# Patient Record
Sex: Female | Born: 1960 | Race: Black or African American | Hispanic: No | Marital: Married
Health system: Southern US, Community
[De-identification: ages and names within clinical notes are randomized; demographics above are authoritative.]

---

## 1998-05-02 ENCOUNTER — Inpatient Hospital Stay (HOSPITAL_COMMUNITY): Admission: AD | Admit: 1998-05-02 | Discharge: 1998-05-07 | Payer: Self-pay | Admitting: Family Medicine

## 1999-03-20 ENCOUNTER — Inpatient Hospital Stay (HOSPITAL_COMMUNITY): Admission: AD | Admit: 1999-03-20 | Discharge: 1999-04-07 | Payer: Self-pay | Admitting: Family Medicine

## 1999-03-21 ENCOUNTER — Encounter: Payer: Self-pay | Admitting: Family Medicine

## 1999-03-26 ENCOUNTER — Encounter: Payer: Self-pay | Admitting: Family Medicine

## 1999-03-27 ENCOUNTER — Encounter: Payer: Self-pay | Admitting: Gastroenterology

## 2001-09-18 ENCOUNTER — Encounter (HOSPITAL_COMMUNITY): Admission: RE | Admit: 2001-09-18 | Discharge: 2001-10-18 | Payer: Self-pay | Admitting: Rheumatology

## 2002-03-05 ENCOUNTER — Encounter (HOSPITAL_COMMUNITY): Admission: RE | Admit: 2002-03-05 | Discharge: 2002-04-04 | Payer: Self-pay | Admitting: Rheumatology

## 2002-08-20 ENCOUNTER — Encounter (HOSPITAL_COMMUNITY): Admission: RE | Admit: 2002-08-20 | Discharge: 2002-09-19 | Payer: Self-pay | Admitting: Rheumatology

## 2003-02-18 ENCOUNTER — Encounter (HOSPITAL_COMMUNITY): Admission: RE | Admit: 2003-02-18 | Discharge: 2003-03-20 | Payer: Self-pay | Admitting: Rheumatology

## 2003-09-02 ENCOUNTER — Encounter (HOSPITAL_COMMUNITY): Admission: RE | Admit: 2003-09-02 | Discharge: 2003-09-30 | Payer: Self-pay | Admitting: Rheumatology

## 2003-09-03 ENCOUNTER — Encounter: Payer: Self-pay | Admitting: Family Medicine

## 2003-09-03 ENCOUNTER — Ambulatory Visit (HOSPITAL_COMMUNITY): Admission: RE | Admit: 2003-09-03 | Discharge: 2003-09-03 | Payer: Self-pay | Admitting: Family Medicine

## 2004-08-10 ENCOUNTER — Emergency Department (HOSPITAL_COMMUNITY): Admission: EM | Admit: 2004-08-10 | Discharge: 2004-08-10 | Payer: Self-pay | Admitting: Emergency Medicine

## 2005-06-20 ENCOUNTER — Ambulatory Visit (HOSPITAL_COMMUNITY): Admission: RE | Admit: 2005-06-20 | Discharge: 2005-06-20 | Payer: Self-pay | Admitting: Family Medicine

## 2006-05-03 ENCOUNTER — Inpatient Hospital Stay (HOSPITAL_COMMUNITY): Admission: EM | Admit: 2006-05-03 | Discharge: 2006-05-07 | Payer: Self-pay | Admitting: Emergency Medicine

## 2006-05-06 ENCOUNTER — Ambulatory Visit: Payer: Self-pay | Admitting: Internal Medicine

## 2006-05-10 ENCOUNTER — Inpatient Hospital Stay (HOSPITAL_COMMUNITY): Admission: EM | Admit: 2006-05-10 | Discharge: 2006-05-12 | Payer: Self-pay | Admitting: Emergency Medicine

## 2006-05-31 ENCOUNTER — Emergency Department (HOSPITAL_COMMUNITY): Admission: EM | Admit: 2006-05-31 | Discharge: 2006-05-31 | Payer: Self-pay | Admitting: Emergency Medicine

## 2006-06-12 ENCOUNTER — Inpatient Hospital Stay (HOSPITAL_COMMUNITY): Admission: EM | Admit: 2006-06-12 | Discharge: 2006-06-26 | Payer: Self-pay | Admitting: Emergency Medicine

## 2007-07-05 IMAGING — CT CT HEAD W/O CM
1 series · 16 of 26 positions shown, 20 images · IV contrast (agent unspecified)
Comparison: None

CLINICAL DATA: Altered level of consciousness

HEAD CT WITHOUT CONTRAST:
TECHNIQUE: 5mm collimated images were obtained from the base of the skull
through the vertex according to standard protocol without contrast.

[Series 918: — · axial · 0.45mm/px · z∈[-642,-527]mm · 16 of 26 slices shown, 20 images]
[im 2/26  brain]
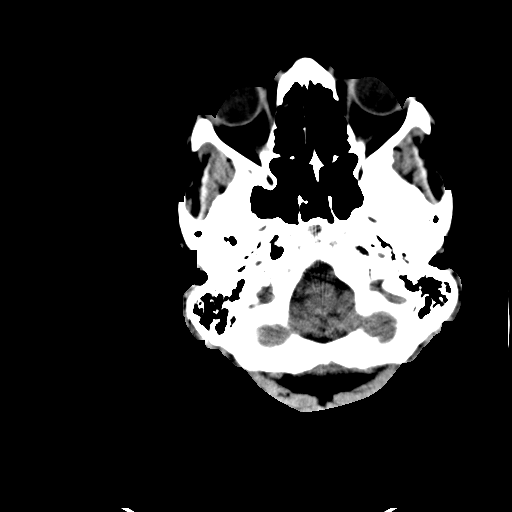
[im 2/26  bone]
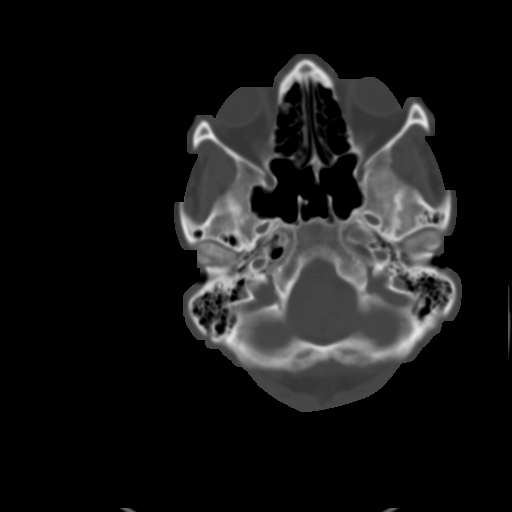
[im 4/26  brain]
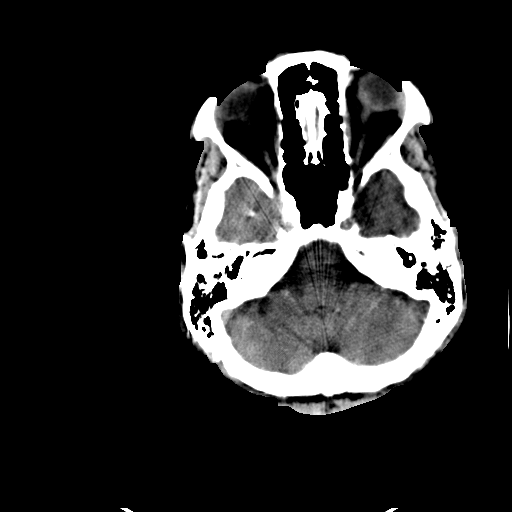
[im 5/26  brain]
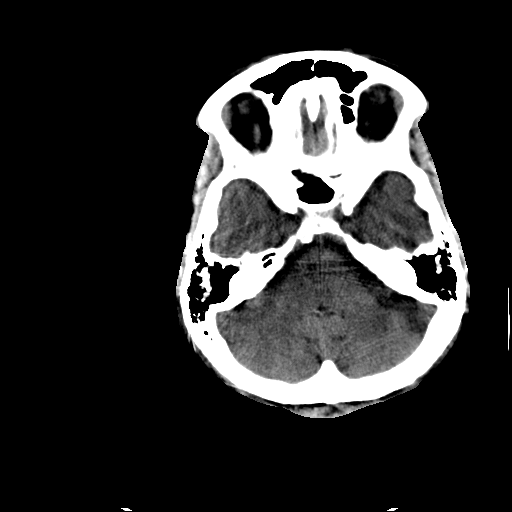
[im 7/26  brain]
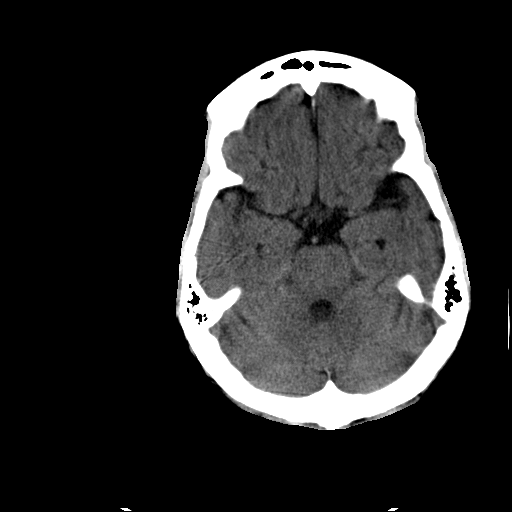
[im 8/26  brain]
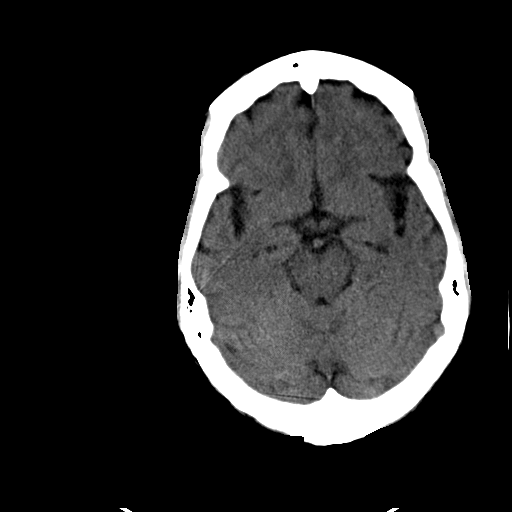
[im 8/26  bone]
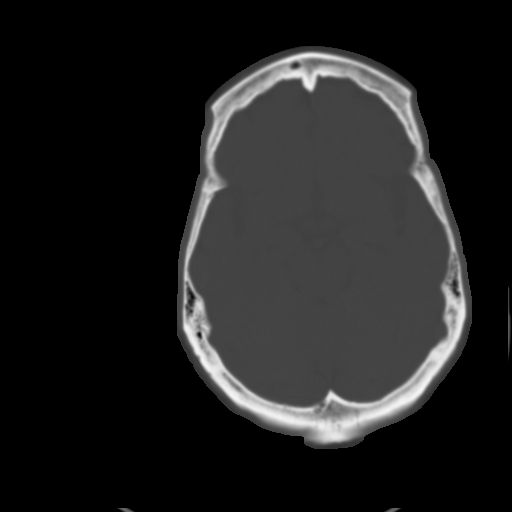
[im 10/26  brain]
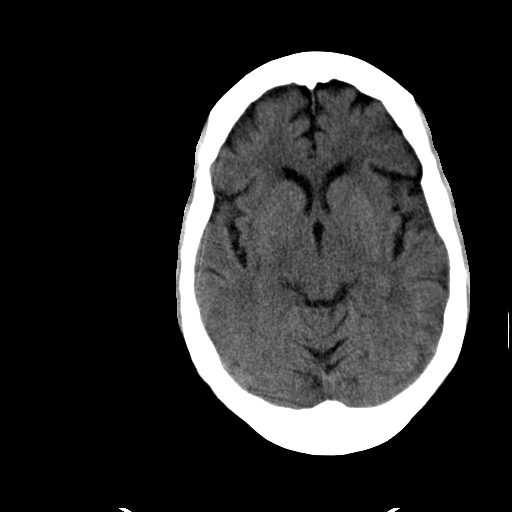
[im 11/26  brain]
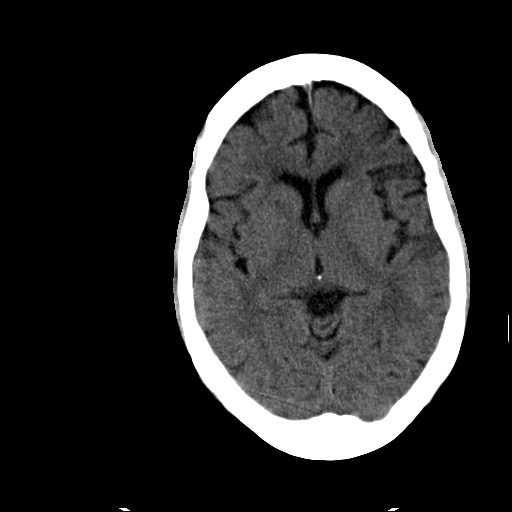
[im 13/26  brain]
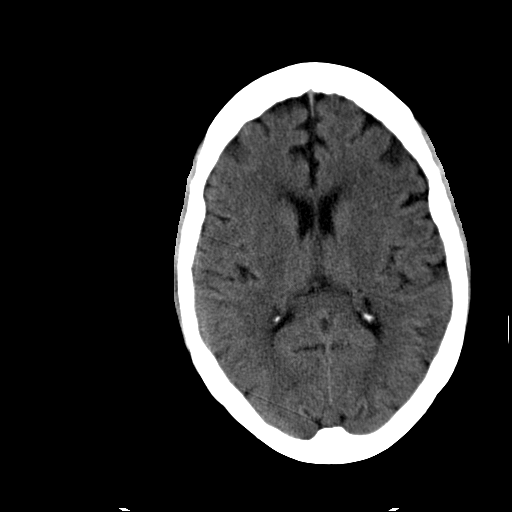
[im 14/26  brain]
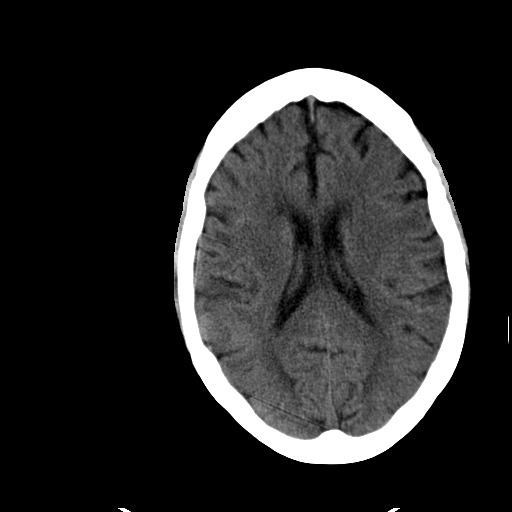
[im 14/26  bone]
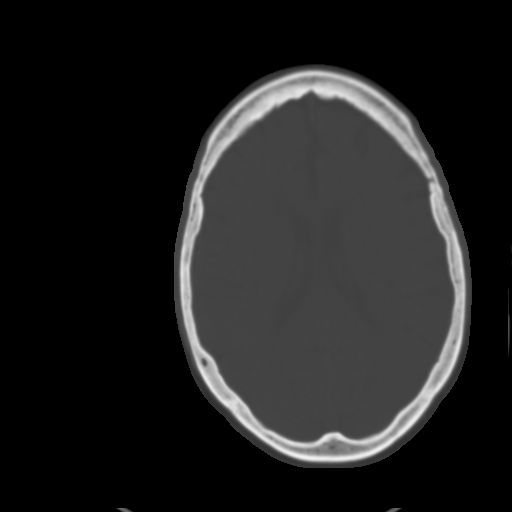
[im 16/26  brain]
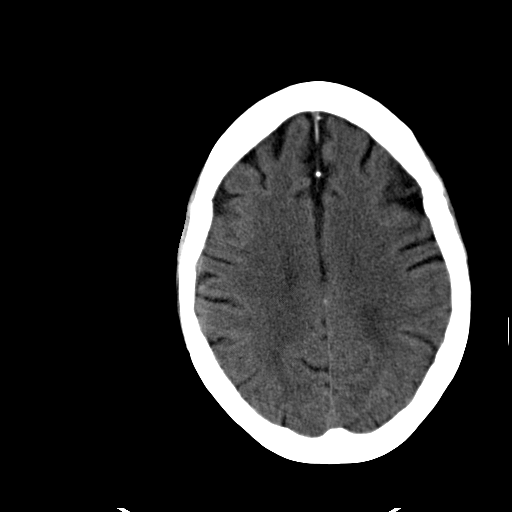
[im 17/26  brain]
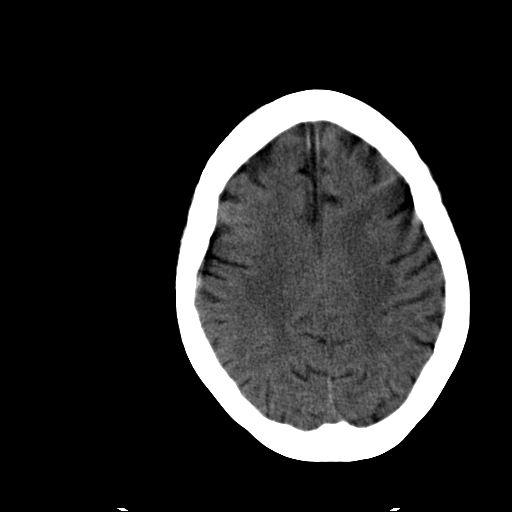
[im 19/26  brain]
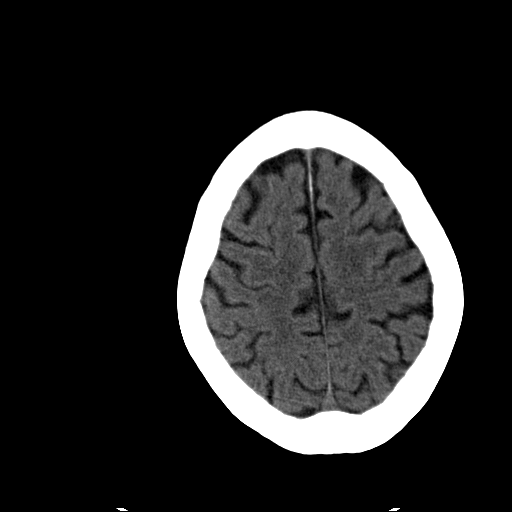
[im 20/26  brain]
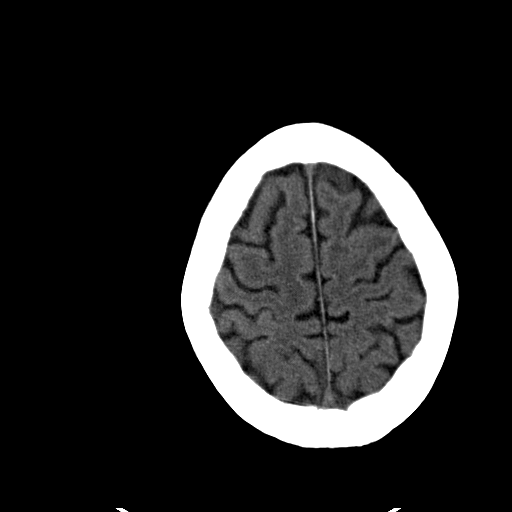
[im 20/26  bone]
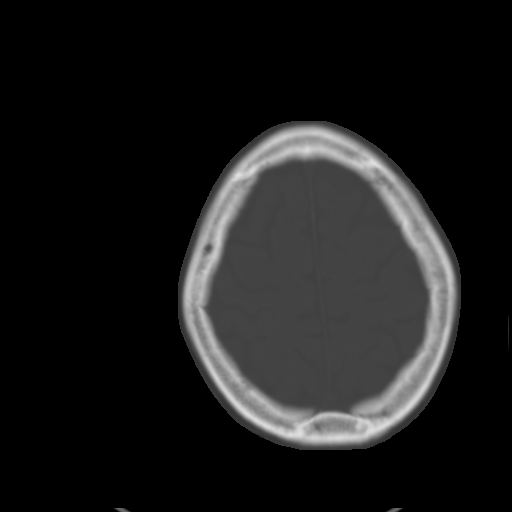
[im 22/26  brain]
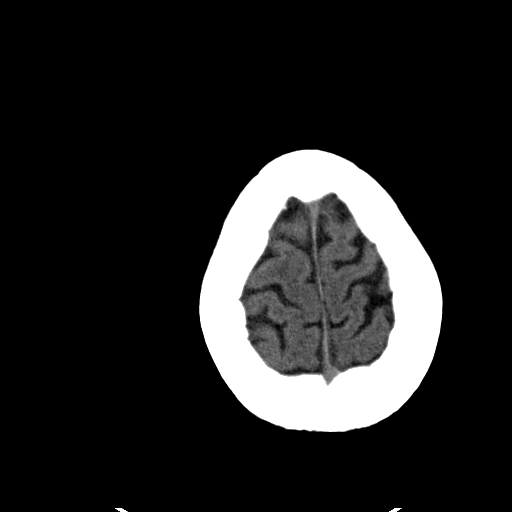
[im 23/26  brain]
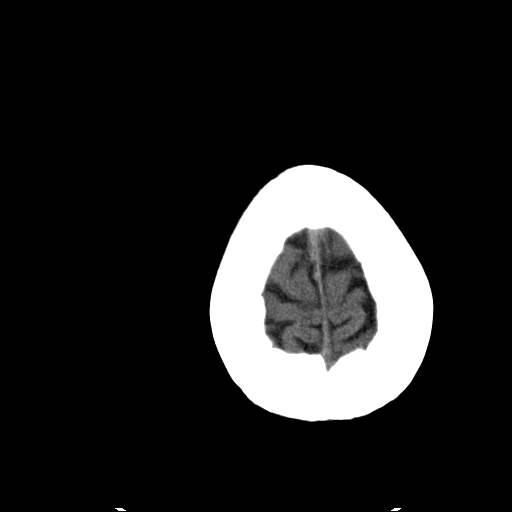
[im 25/26  brain]
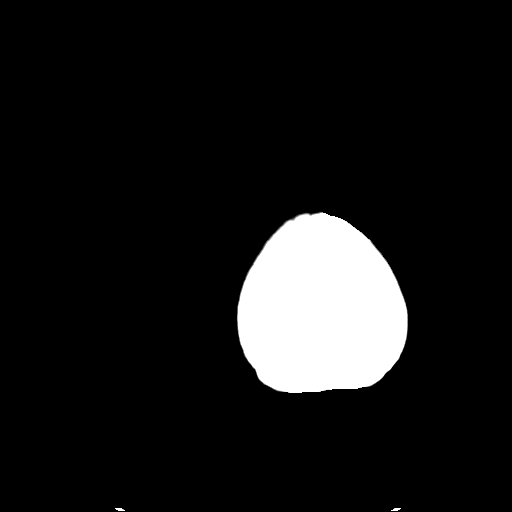

[16 of 26 positions shown; findings below may reference images not displayed]

FINDINGS: There is no evidence of intracranial hemorrhage, brain edema, or mass
effect.  No other intra-axial abnormalities are seen, and the ventricles are
within normal limits.  No abnormal extra-axial fluid collections or masses are
identified.  No skull abnormalities are noted.
IMPRESSION: No acute intracranial abnormality.

## 2007-08-14 IMAGING — CT CT HEAD W/O CM
1 series · 16 of 30 positions shown, 20 images · IV contrast (agent unspecified)
Comparison: 05/11/2006.

CLINICAL DATA: Altered mental status, altered level of consciousness.
 HEAD CT WITHOUT CONTRAST:
TECHNIQUE: Contiguous axial images were obtained from the base of the skull through the vertex according to standard protocol without contrast.

[Series 5015: — · axial · 0.45mm/px · z∈[-650,-515]mm · 16 of 30 slices shown, 20 images]
[im 2/30  brain]
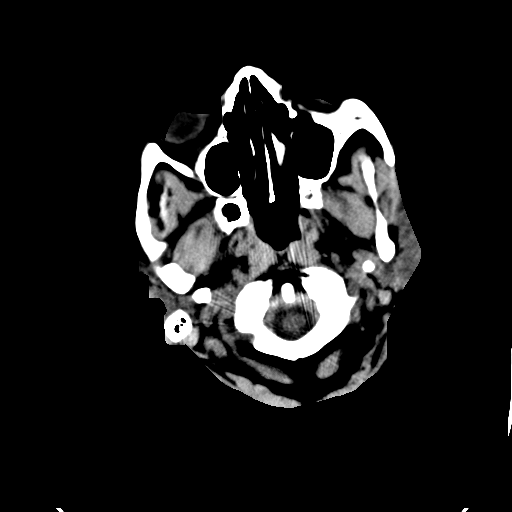
[im 2/30  bone]
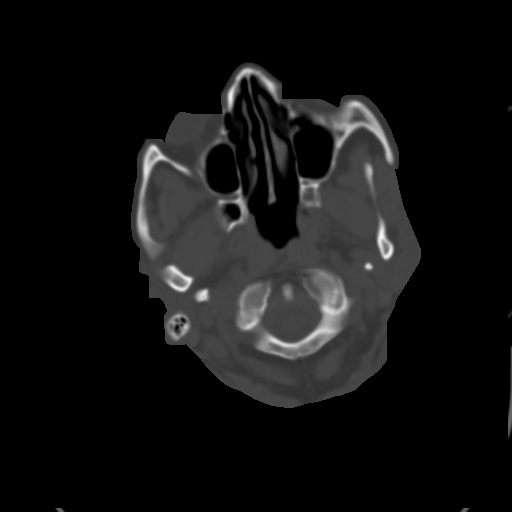
[im 4/30  brain]
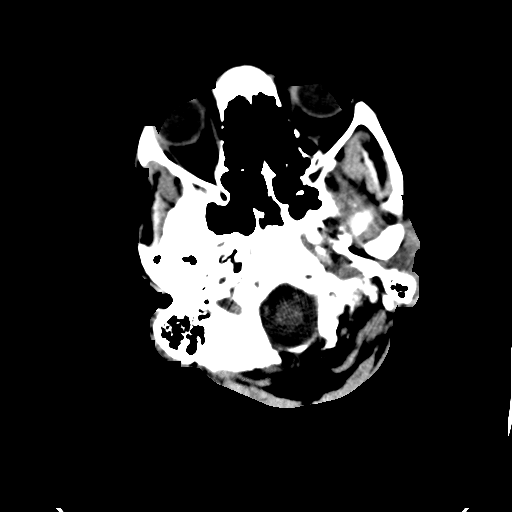
[im 6/30  brain]
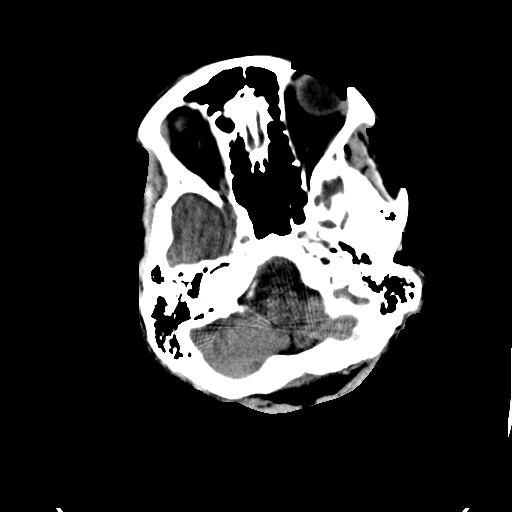
[im 8/30  brain]
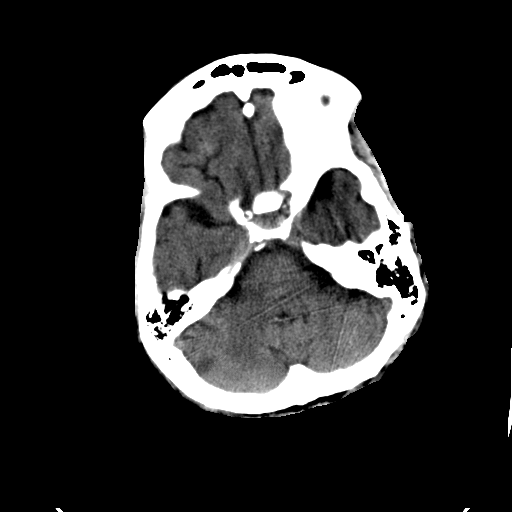
[im 9/30  brain]
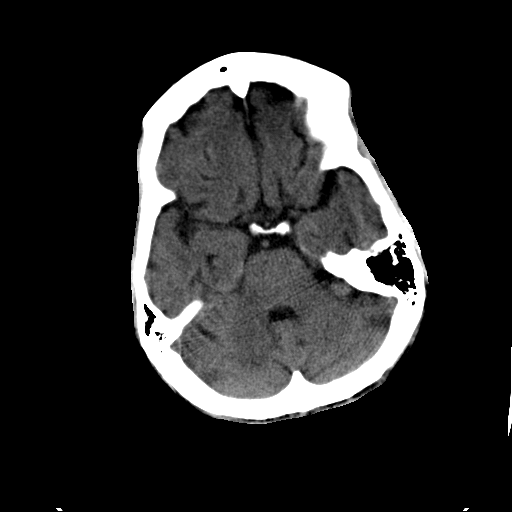
[im 9/30  bone]
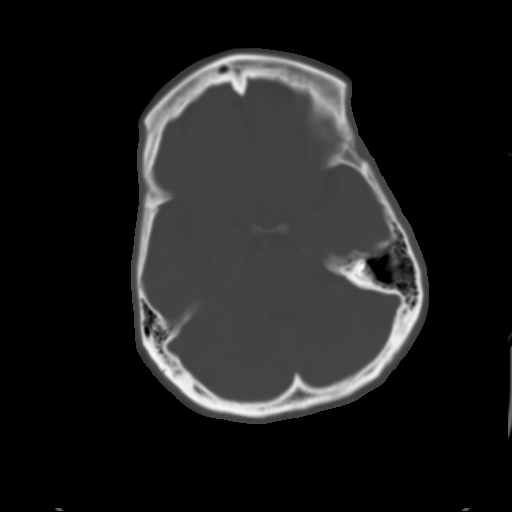
[im 11/30  brain]
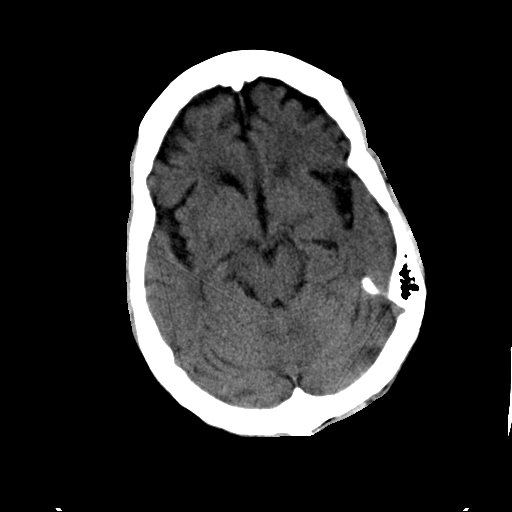
[im 13/30  brain]
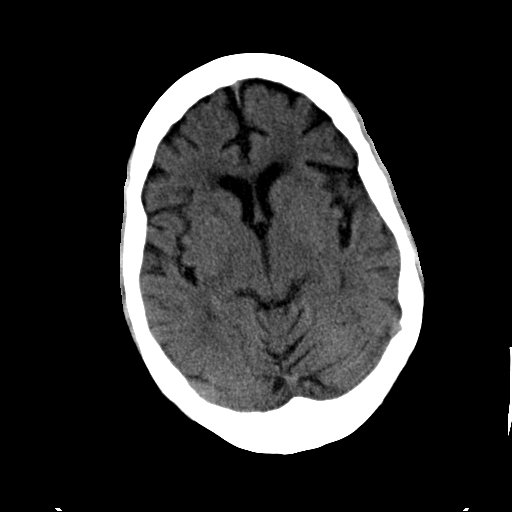
[im 15/30  brain]
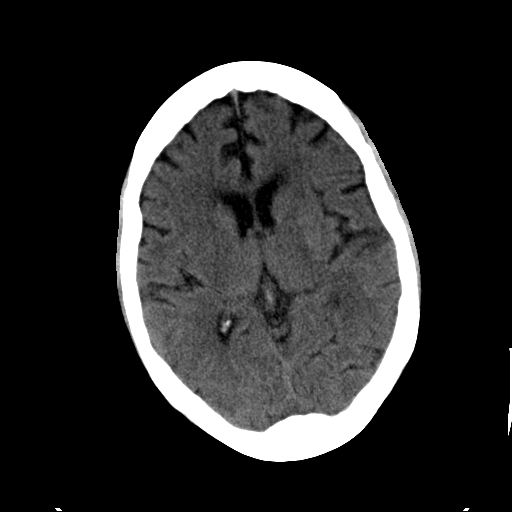
[im 16/30  brain]
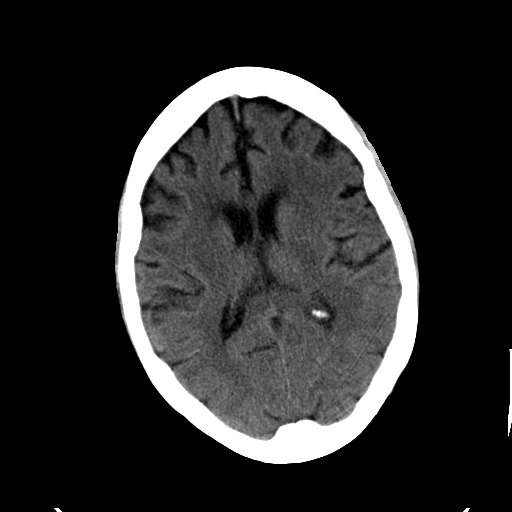
[im 16/30  bone]
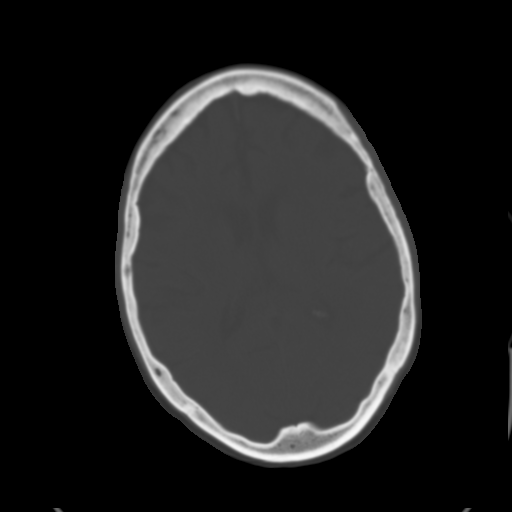
[im 18/30  brain]
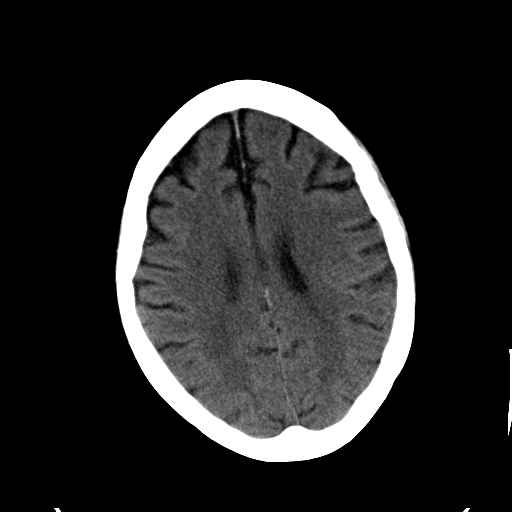
[im 20/30  brain]
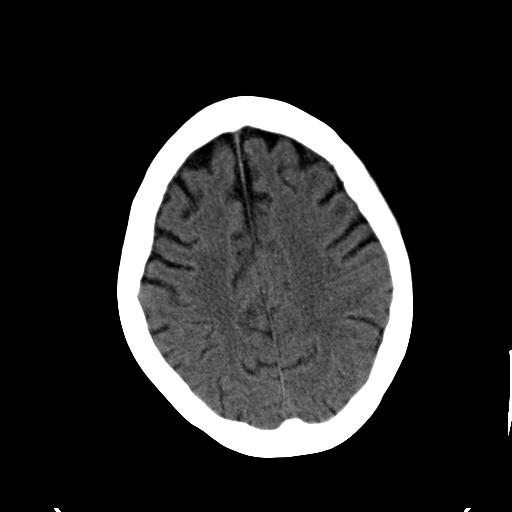
[im 22/30  brain]
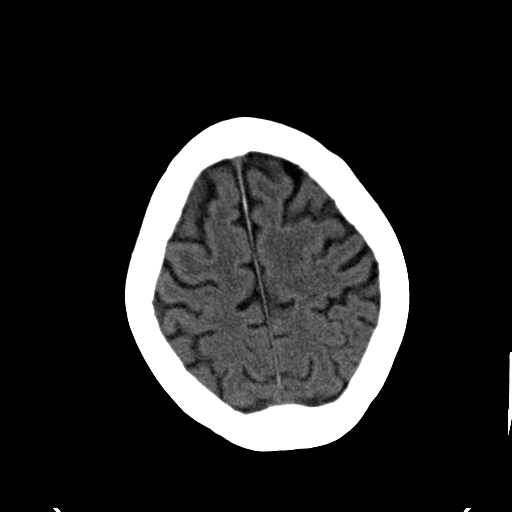
[im 23/30  brain]
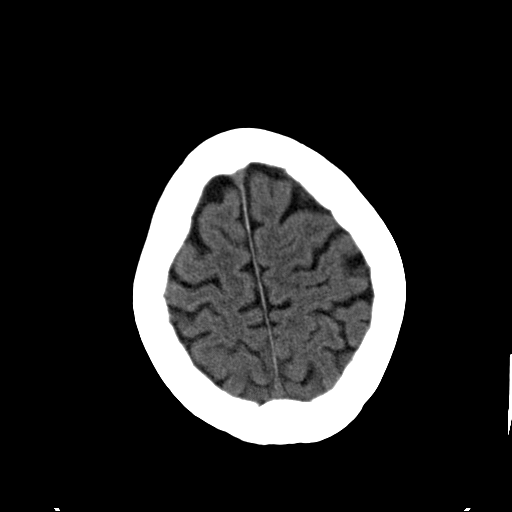
[im 23/30  bone]
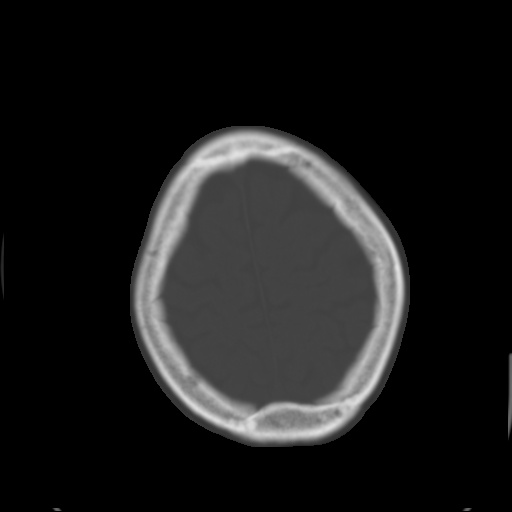
[im 25/30  brain]
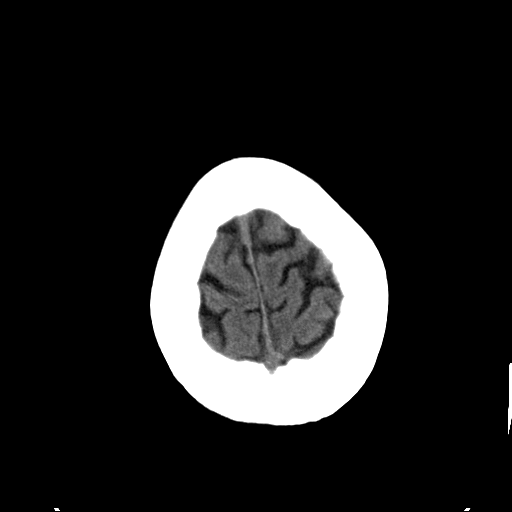
[im 27/30  brain]
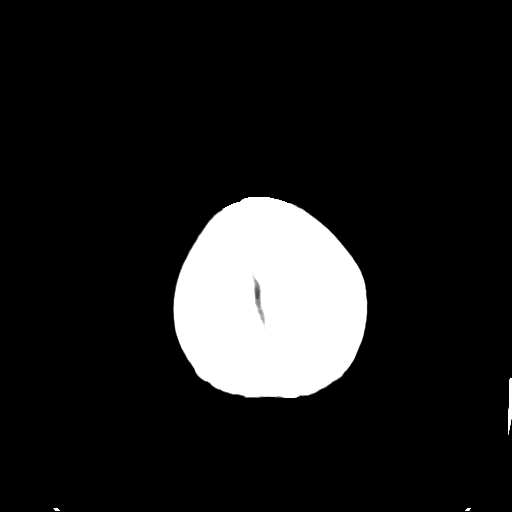
[im 29/30  brain]
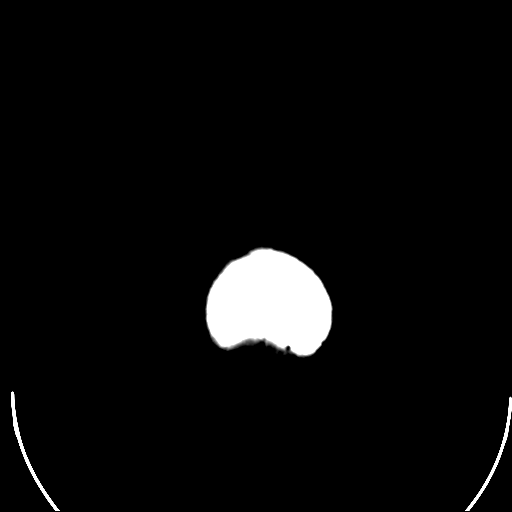

[16 of 30 positions shown; findings below may reference images not displayed]

There is mild low attenuation within the subcortical white matter suggesting chronic small vessel ischemic disease.  The ventricular volumes are normal. The midline is maintained.  There is no edema and no mass effect is identified. 
 No abnormal extraaxial fluid collections, intracranial hemorrhage or masses are noted. 
 The mastoid air cells are normally aerated.  The paranasal sinuses are normally aerated.  
 Review of the bone windows is normal.  No acute intracranial abnormalities.
IMPRESSION: 1.  No acute intracranial abnormalities.
 2.  Chronic small vessel ischemic disease not changed in the interval.

## 2007-08-14 IMAGING — CR DG CHEST 1V PORT
1 series · 1 of 1 positions shown · non-contrast
Comparison: 08/10/04

CLINICAL DATA: Altered level of consciousness.  Reformed smoker.  Cirrhosis of the liver.
 PORTABLE CHEST ? 1 VIEW:

[view not recorded]
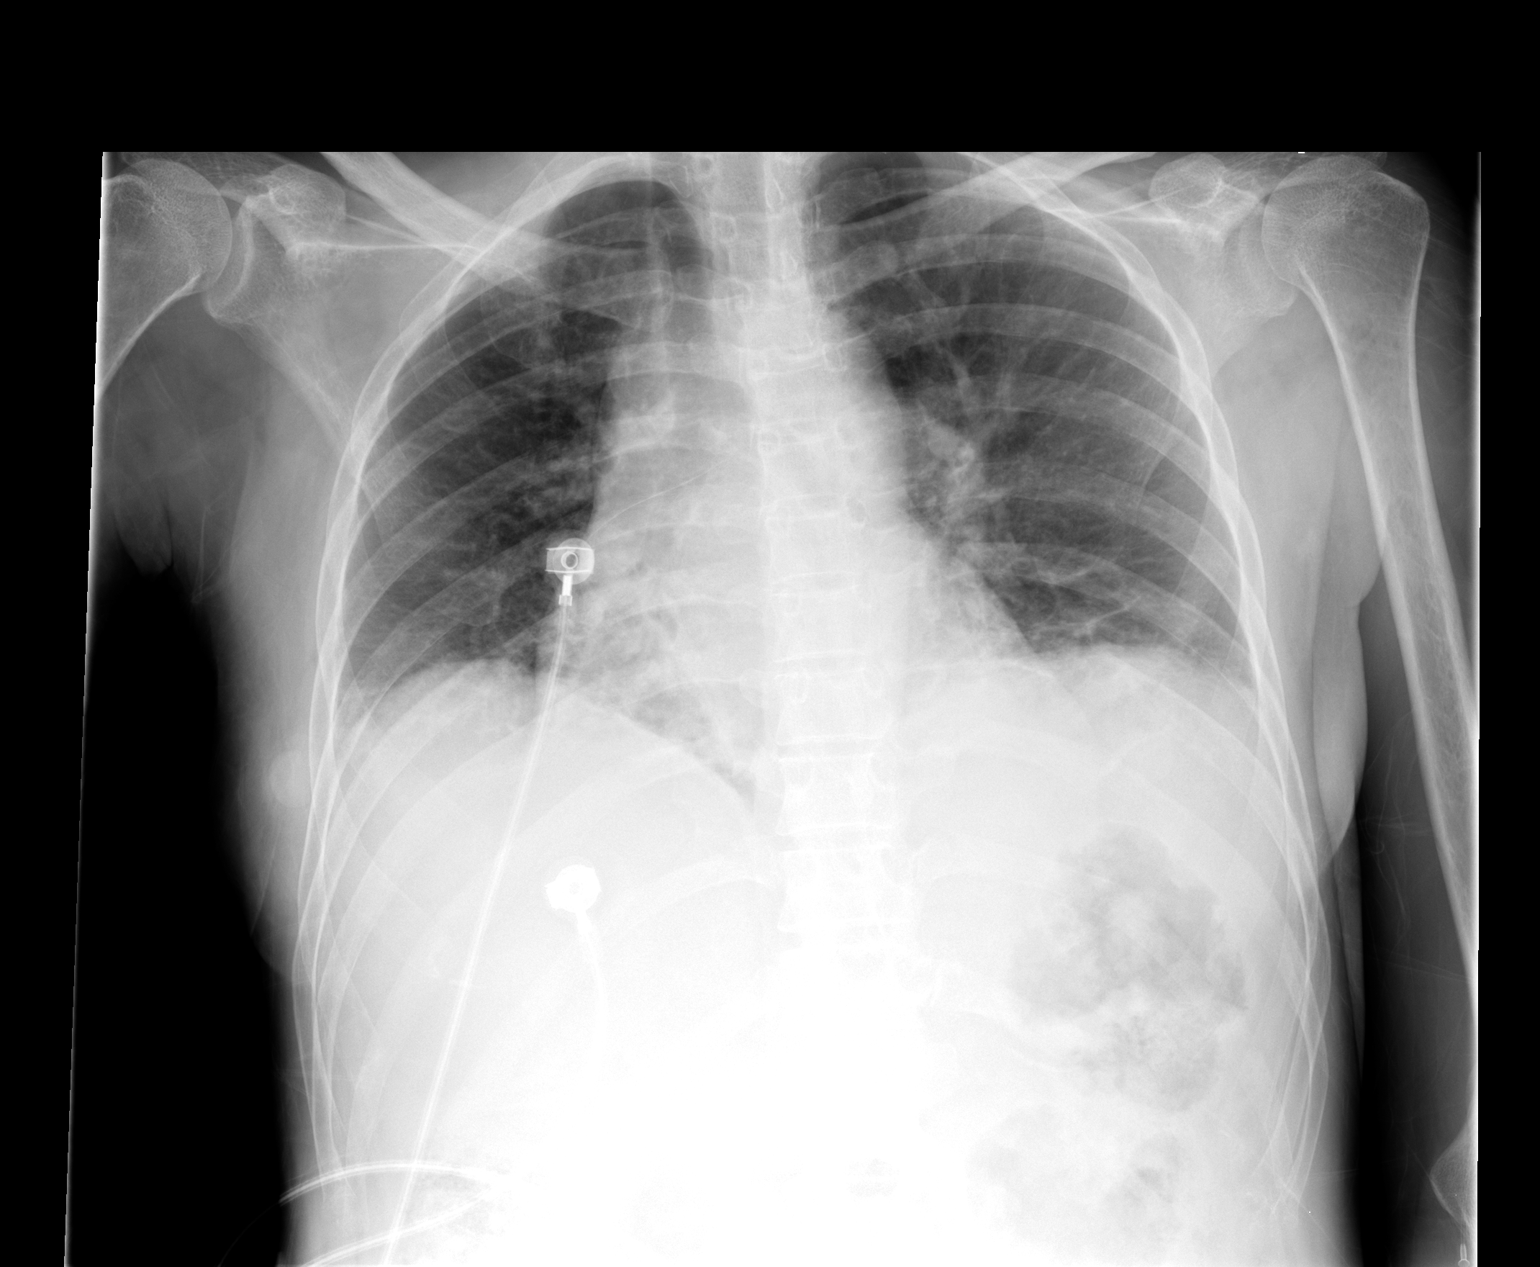

[1 of 1 positions shown; findings below may reference images not displayed]

FINDINGS: An AP semi-erect portable film of the chest made 06/12/06 at 0244 hours is compared to previous PA and lateral views of 08/10/04 and shows significantly poorer basilar aeration.  There is diffuse peribronchial thickening.  No definite cardiomegaly or edema is seen.  The bones appear to be within normal limits.
IMPRESSION: Bilateral basilar atelectasis with diffuse peribronchial thickening.  No definite active infiltrate, pleural effusion, or pneumothorax is seen.

## 2022-06-15 ENCOUNTER — Telehealth: Payer: Self-pay | Admitting: *Deleted

## 2022-07-17 NOTE — Telephone Encounter (Signed)
Error message
